# Patient Record
Sex: Male | Born: 1960 | Race: White | Hispanic: No | Marital: Married | Smoking: Never smoker
Health system: Southern US, Community
[De-identification: ages and names within clinical notes are randomized; demographics above are authoritative.]

## PROBLEM LIST (undated history)

## (undated) DIAGNOSIS — F419 Anxiety disorder, unspecified: Secondary | ICD-10-CM

---

## 1997-07-08 ENCOUNTER — Emergency Department (HOSPITAL_COMMUNITY): Admission: EM | Admit: 1997-07-08 | Discharge: 1997-07-08 | Payer: Self-pay | Admitting: Emergency Medicine

## 1998-10-09 ENCOUNTER — Encounter: Payer: Self-pay | Admitting: Emergency Medicine

## 1998-10-09 ENCOUNTER — Emergency Department (HOSPITAL_COMMUNITY): Admission: EM | Admit: 1998-10-09 | Discharge: 1998-10-10 | Payer: Self-pay | Admitting: Emergency Medicine

## 1998-11-25 ENCOUNTER — Ambulatory Visit (HOSPITAL_COMMUNITY): Admission: RE | Admit: 1998-11-25 | Discharge: 1998-11-25 | Payer: Self-pay

## 2016-03-18 DIAGNOSIS — Z0000 Encounter for general adult medical examination without abnormal findings: Secondary | ICD-10-CM | POA: Diagnosis not present

## 2016-05-10 DIAGNOSIS — D2262 Melanocytic nevi of left upper limb, including shoulder: Secondary | ICD-10-CM | POA: Diagnosis not present

## 2016-05-10 DIAGNOSIS — D225 Melanocytic nevi of trunk: Secondary | ICD-10-CM | POA: Diagnosis not present

## 2016-05-10 DIAGNOSIS — D2261 Melanocytic nevi of right upper limb, including shoulder: Secondary | ICD-10-CM | POA: Diagnosis not present

## 2016-05-27 DIAGNOSIS — Z0000 Encounter for general adult medical examination without abnormal findings: Secondary | ICD-10-CM | POA: Diagnosis not present

## 2016-05-27 DIAGNOSIS — Z13220 Encounter for screening for lipoid disorders: Secondary | ICD-10-CM | POA: Diagnosis not present

## 2016-07-19 DIAGNOSIS — R972 Elevated prostate specific antigen [PSA]: Secondary | ICD-10-CM | POA: Diagnosis not present

## 2016-09-09 DIAGNOSIS — R972 Elevated prostate specific antigen [PSA]: Secondary | ICD-10-CM | POA: Diagnosis not present

## 2016-09-09 DIAGNOSIS — R350 Frequency of micturition: Secondary | ICD-10-CM | POA: Diagnosis not present

## 2017-01-22 DIAGNOSIS — H5711 Ocular pain, right eye: Secondary | ICD-10-CM | POA: Diagnosis not present

## 2017-02-27 ENCOUNTER — Encounter (HOSPITAL_COMMUNITY): Payer: Self-pay

## 2017-02-27 ENCOUNTER — Emergency Department (HOSPITAL_COMMUNITY)
Admission: EM | Admit: 2017-02-27 | Discharge: 2017-02-27 | Disposition: A | Discharge status: Home / Self Care | Payer: 59 | Attending: Emergency Medicine | Admitting: Emergency Medicine

## 2017-02-27 ENCOUNTER — Other Ambulatory Visit: Payer: Self-pay

## 2017-02-27 ENCOUNTER — Emergency Department (HOSPITAL_COMMUNITY): Payer: 59

## 2017-02-27 DIAGNOSIS — S0280XA Fracture of other specified skull and facial bones, unspecified side, initial encounter for closed fracture: Secondary | ICD-10-CM | POA: Insufficient documentation

## 2017-02-27 DIAGNOSIS — S0083XA Contusion of other part of head, initial encounter: Secondary | ICD-10-CM | POA: Diagnosis not present

## 2017-02-27 DIAGNOSIS — Y9367 Activity, basketball: Secondary | ICD-10-CM | POA: Diagnosis not present

## 2017-02-27 DIAGNOSIS — Y929 Unspecified place or not applicable: Secondary | ICD-10-CM | POA: Diagnosis not present

## 2017-02-27 DIAGNOSIS — W500XXA Accidental hit or strike by another person, initial encounter: Secondary | ICD-10-CM | POA: Diagnosis not present

## 2017-02-27 DIAGNOSIS — Y998 Other external cause status: Secondary | ICD-10-CM | POA: Diagnosis not present

## 2017-02-27 DIAGNOSIS — S0990XA Unspecified injury of head, initial encounter: Secondary | ICD-10-CM | POA: Diagnosis present

## 2017-02-27 DIAGNOSIS — S0285XA Fracture of orbit, unspecified, initial encounter for closed fracture: Secondary | ICD-10-CM

## 2017-02-27 DIAGNOSIS — S060X1A Concussion with loss of consciousness of 30 minutes or less, initial encounter: Secondary | ICD-10-CM | POA: Diagnosis not present

## 2017-02-27 DIAGNOSIS — S0231XA Fracture of orbital floor, right side, initial encounter for closed fracture: Secondary | ICD-10-CM | POA: Diagnosis not present

## 2017-02-27 HISTORY — DX: Anxiety disorder, unspecified: F41.9

## 2017-02-27 MED ORDER — HYDROCODONE-ACETAMINOPHEN 5-325 MG PO TABS: 1.0000 | ORAL_TABLET | ORAL | 0 refills | Status: AC | PRN

## 2017-02-27 MED ORDER — AMOXICILLIN-POT CLAVULANATE 875-125 MG PO TABS: 1.0000 | ORAL_TABLET | Freq: Two times a day (BID) | ORAL | 0 refills | Status: AC

## 2017-02-27 NOTE — ED Provider Notes (Signed)
[facility] Provider Note   CSN: [id number] Arrival date & time: 02/27/17  1601     History   Chief Complaint Chief Complaint  Patient presents with  . Head Injury    HPI Shawn Rodgers is a 57 y.o. male.  57 year old male presents with right-sided face pain after being struck with an elbow while playing basketball.  Had a brief loss of consciousness and now complains of pain to his right maxilla.  Denies any blurred vision or diplopia.  No headache or vomiting.  No neck pain.  Has had some paresthesias to his right upper teeth but denies any problems with mastication.  No treatment used prior to arrival.      Past Medical History:  Diagnosis Date  . Anxiety     There are no active problems to display for this patient.   History reviewed. No pertinent surgical history.     Home Medications    Prior to Admission medications   Not on File    Family History History reviewed. No pertinent family history.  Social History Social History   Tobacco Use  . Smoking status: Never Smoker  . Smokeless tobacco: Never Used  Substance Use Topics  . Alcohol use: Yes    Comment: occ  . Drug use: No     Allergies   Patient has no known allergies.   Review of Systems Review of Systems  All other systems reviewed and are negative.    Physical Exam Updated Vital Signs BP (!) 152/100   Pulse 70   Temp 97.8 F (36.6 C) (Oral)   Resp 18   Ht 1.854 m (6\' 1" )   Wt 95.3 kg (210 lb)   SpO2 100%   BMI 27.71 kg/m   Physical Exam  Constitutional: He is oriented to person, place, and time. He appears well-developed and well-nourished.  Non-toxic appearance. No distress.  HENT:  Head:    Eyes: Conjunctivae, EOM and lids are normal. Pupils are equal, round, and reactive to light. Right eye exhibits no chemosis. Left eye exhibits no chemosis.  Neck: Normal range of motion. Neck supple. No tracheal deviation present. No  thyroid mass present.  Cardiovascular: Normal rate, regular rhythm and normal heart sounds. Exam reveals no gallop.  No murmur heard. Pulmonary/Chest: Effort normal and breath sounds normal. No stridor. No respiratory distress. He has no decreased breath sounds. He has no wheezes. He has no rhonchi. He has no rales.  Abdominal: Soft. Normal appearance and bowel sounds are normal. He exhibits no distension. There is no tenderness. There is no rebound and no CVA tenderness.  Musculoskeletal: Normal range of motion. He exhibits no edema or tenderness.  Neurological: He is alert and oriented to person, place, and time. He has normal strength. No cranial nerve deficit or sensory deficit. GCS eye subscore is 4. GCS verbal subscore is 5. GCS motor subscore is 6.  Skin: Skin is warm and dry. No abrasion and no rash noted.  Psychiatric: He has a normal mood and affect. His speech is normal and behavior is normal.  Nursing note and vitals reviewed.    ED Treatments / Results  Labs (all labs ordered are listed, but only abnormal results are displayed) Labs Reviewed - No data to display  EKG  EKG Interpretation None       Radiology No results found.  Procedures Procedures (including critical care time)  Medications Ordered in ED Medications - No data to display  Initial Impression / Assessment and Plan / ED Course  I have reviewed the triage vital signs and the nursing notes.  Pertinent labs & imaging results that were available during my care of the patient were reviewed by me and considered in my medical decision making (see chart for details).     Head CT results noted.  Discussed with Dr. Mancel Parsons, maxillofacial surgeon on call, will see the patient in follow-up.  Patient instructed to not blow his nose and will place on antibiotics.  Final Clinical Impressions(s) / ED Diagnoses   Final diagnoses:  None    ED Discharge Orders    None       Lacretia Leigh, MD 02/27/17  1824

## 2017-02-27 NOTE — ED Triage Notes (Signed)
Pt states he was playing basketball and was elbowed in the face. Pt states brief period of LOC. Pt has redness and pain to the right face. AOX4 now.

## 2017-02-27 NOTE — Discharge Instructions (Signed)
Do not blow your nose if you have to sneeze, please sneeze through your mouth

## 2017-04-08 DIAGNOSIS — Z0000 Encounter for general adult medical examination without abnormal findings: Secondary | ICD-10-CM | POA: Diagnosis not present

## 2017-04-08 DIAGNOSIS — Z13220 Encounter for screening for lipoid disorders: Secondary | ICD-10-CM | POA: Diagnosis not present

## 2017-04-20 DIAGNOSIS — D509 Iron deficiency anemia, unspecified: Secondary | ICD-10-CM | POA: Diagnosis not present

## 2017-06-21 DIAGNOSIS — D509 Iron deficiency anemia, unspecified: Secondary | ICD-10-CM | POA: Diagnosis not present

## 2017-07-07 DIAGNOSIS — D509 Iron deficiency anemia, unspecified: Secondary | ICD-10-CM | POA: Diagnosis not present

## 2017-08-02 DIAGNOSIS — D509 Iron deficiency anemia, unspecified: Secondary | ICD-10-CM | POA: Diagnosis not present

## 2017-08-02 DIAGNOSIS — K5730 Diverticulosis of large intestine without perforation or abscess without bleeding: Secondary | ICD-10-CM | POA: Diagnosis not present

## 2017-08-02 DIAGNOSIS — D126 Benign neoplasm of colon, unspecified: Secondary | ICD-10-CM | POA: Diagnosis not present

## 2017-10-03 DIAGNOSIS — D509 Iron deficiency anemia, unspecified: Secondary | ICD-10-CM | POA: Diagnosis not present

## 2017-10-04 DIAGNOSIS — H903 Sensorineural hearing loss, bilateral: Secondary | ICD-10-CM | POA: Diagnosis not present

## 2017-10-21 DIAGNOSIS — Z23 Encounter for immunization: Secondary | ICD-10-CM | POA: Diagnosis not present

## 2017-11-01 DIAGNOSIS — H524 Presbyopia: Secondary | ICD-10-CM | POA: Diagnosis not present

## 2017-11-01 DIAGNOSIS — H5213 Myopia, bilateral: Secondary | ICD-10-CM | POA: Diagnosis not present

## 2018-02-20 DIAGNOSIS — M5417 Radiculopathy, lumbosacral region: Secondary | ICD-10-CM | POA: Diagnosis not present

## 2018-02-20 DIAGNOSIS — M9903 Segmental and somatic dysfunction of lumbar region: Secondary | ICD-10-CM | POA: Diagnosis not present

## 2018-02-20 DIAGNOSIS — M9905 Segmental and somatic dysfunction of pelvic region: Secondary | ICD-10-CM | POA: Diagnosis not present

## 2018-02-21 DIAGNOSIS — M5417 Radiculopathy, lumbosacral region: Secondary | ICD-10-CM | POA: Diagnosis not present

## 2018-02-21 DIAGNOSIS — M9905 Segmental and somatic dysfunction of pelvic region: Secondary | ICD-10-CM | POA: Diagnosis not present

## 2018-02-21 DIAGNOSIS — M9903 Segmental and somatic dysfunction of lumbar region: Secondary | ICD-10-CM | POA: Diagnosis not present

## 2018-02-22 DIAGNOSIS — M5417 Radiculopathy, lumbosacral region: Secondary | ICD-10-CM | POA: Diagnosis not present

## 2018-02-22 DIAGNOSIS — M9903 Segmental and somatic dysfunction of lumbar region: Secondary | ICD-10-CM | POA: Diagnosis not present

## 2018-02-22 DIAGNOSIS — M9905 Segmental and somatic dysfunction of pelvic region: Secondary | ICD-10-CM | POA: Diagnosis not present

## 2018-02-24 DIAGNOSIS — M9903 Segmental and somatic dysfunction of lumbar region: Secondary | ICD-10-CM | POA: Diagnosis not present

## 2018-02-24 DIAGNOSIS — M9905 Segmental and somatic dysfunction of pelvic region: Secondary | ICD-10-CM | POA: Diagnosis not present

## 2018-02-24 DIAGNOSIS — M5417 Radiculopathy, lumbosacral region: Secondary | ICD-10-CM | POA: Diagnosis not present

## 2018-02-27 DIAGNOSIS — M9903 Segmental and somatic dysfunction of lumbar region: Secondary | ICD-10-CM | POA: Diagnosis not present

## 2018-02-27 DIAGNOSIS — M9905 Segmental and somatic dysfunction of pelvic region: Secondary | ICD-10-CM | POA: Diagnosis not present

## 2018-02-27 DIAGNOSIS — M5417 Radiculopathy, lumbosacral region: Secondary | ICD-10-CM | POA: Diagnosis not present

## 2018-03-01 DIAGNOSIS — M5417 Radiculopathy, lumbosacral region: Secondary | ICD-10-CM | POA: Diagnosis not present

## 2018-03-01 DIAGNOSIS — M9903 Segmental and somatic dysfunction of lumbar region: Secondary | ICD-10-CM | POA: Diagnosis not present

## 2018-03-01 DIAGNOSIS — M9905 Segmental and somatic dysfunction of pelvic region: Secondary | ICD-10-CM | POA: Diagnosis not present

## 2018-03-03 DIAGNOSIS — M9903 Segmental and somatic dysfunction of lumbar region: Secondary | ICD-10-CM | POA: Diagnosis not present

## 2018-03-03 DIAGNOSIS — M9905 Segmental and somatic dysfunction of pelvic region: Secondary | ICD-10-CM | POA: Diagnosis not present

## 2018-03-03 DIAGNOSIS — M5417 Radiculopathy, lumbosacral region: Secondary | ICD-10-CM | POA: Diagnosis not present

## 2018-03-07 DIAGNOSIS — M9903 Segmental and somatic dysfunction of lumbar region: Secondary | ICD-10-CM | POA: Diagnosis not present

## 2018-03-07 DIAGNOSIS — M5417 Radiculopathy, lumbosacral region: Secondary | ICD-10-CM | POA: Diagnosis not present

## 2018-03-07 DIAGNOSIS — M9905 Segmental and somatic dysfunction of pelvic region: Secondary | ICD-10-CM | POA: Diagnosis not present

## 2018-03-09 DIAGNOSIS — M9905 Segmental and somatic dysfunction of pelvic region: Secondary | ICD-10-CM | POA: Diagnosis not present

## 2018-03-09 DIAGNOSIS — M5417 Radiculopathy, lumbosacral region: Secondary | ICD-10-CM | POA: Diagnosis not present

## 2018-03-09 DIAGNOSIS — M9903 Segmental and somatic dysfunction of lumbar region: Secondary | ICD-10-CM | POA: Diagnosis not present

## 2018-03-14 DIAGNOSIS — M9903 Segmental and somatic dysfunction of lumbar region: Secondary | ICD-10-CM | POA: Diagnosis not present

## 2018-03-14 DIAGNOSIS — M5417 Radiculopathy, lumbosacral region: Secondary | ICD-10-CM | POA: Diagnosis not present

## 2018-03-14 DIAGNOSIS — M9905 Segmental and somatic dysfunction of pelvic region: Secondary | ICD-10-CM | POA: Diagnosis not present

## 2018-03-16 DIAGNOSIS — M5417 Radiculopathy, lumbosacral region: Secondary | ICD-10-CM | POA: Diagnosis not present

## 2018-03-16 DIAGNOSIS — M9903 Segmental and somatic dysfunction of lumbar region: Secondary | ICD-10-CM | POA: Diagnosis not present

## 2018-03-16 DIAGNOSIS — M9905 Segmental and somatic dysfunction of pelvic region: Secondary | ICD-10-CM | POA: Diagnosis not present

## 2018-03-21 DIAGNOSIS — M9903 Segmental and somatic dysfunction of lumbar region: Secondary | ICD-10-CM | POA: Diagnosis not present

## 2018-03-21 DIAGNOSIS — M5417 Radiculopathy, lumbosacral region: Secondary | ICD-10-CM | POA: Diagnosis not present

## 2018-03-21 DIAGNOSIS — M9905 Segmental and somatic dysfunction of pelvic region: Secondary | ICD-10-CM | POA: Diagnosis not present

## 2018-05-03 DIAGNOSIS — D509 Iron deficiency anemia, unspecified: Secondary | ICD-10-CM | POA: Diagnosis not present

## 2018-05-03 DIAGNOSIS — Z0000 Encounter for general adult medical examination without abnormal findings: Secondary | ICD-10-CM | POA: Diagnosis not present

## 2018-05-03 DIAGNOSIS — Z86010 Personal history of colonic polyps: Secondary | ICD-10-CM | POA: Diagnosis not present

## 2018-05-03 DIAGNOSIS — Z125 Encounter for screening for malignant neoplasm of prostate: Secondary | ICD-10-CM | POA: Diagnosis not present

## 2018-05-03 DIAGNOSIS — Z13220 Encounter for screening for lipoid disorders: Secondary | ICD-10-CM | POA: Diagnosis not present

## 2020-01-15 IMAGING — CT CT MAXILLOFACIAL W/O CM
3 series · 15 of 47 positions shown, 18 images · non-contrast
Comparison: None.

CLINICAL DATA: Injury to right eye while playing basketball.
Swelling and bruise.

EXAM:
CT MAXILLOFACIAL WITHOUT CONTRAST
TECHNIQUE: Multidetector CT imaging of the maxillofacial structures was
performed. Multiplanar CT image reconstructions were also generated.

[Series 3: facial/ orbits 2.0 h30s · axial · 0.39mm/px · z∈[-220,-72]mm · 9 of 86 slices shown, 12 images]
[im 6/86  brain]
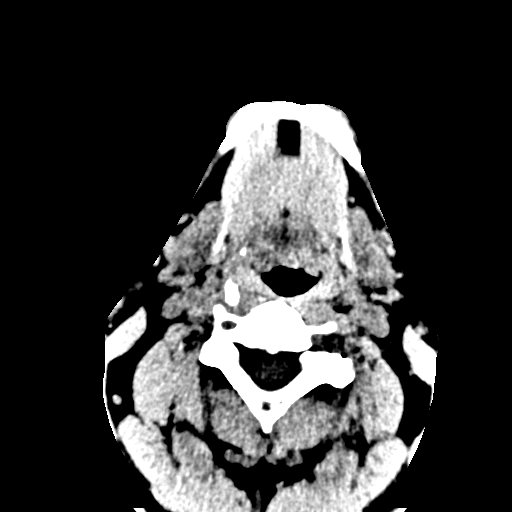
[im 6/86  bone]
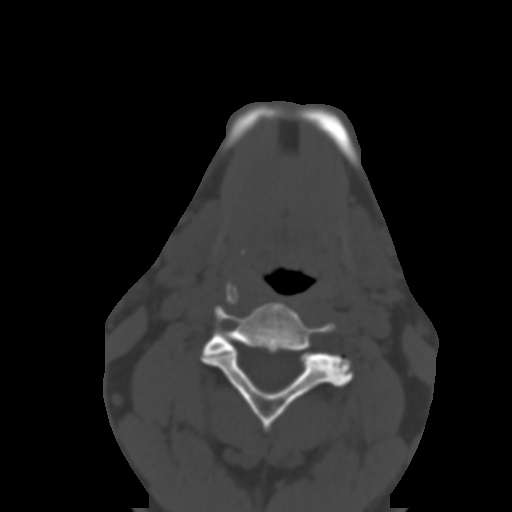
[im 15/86  bone]
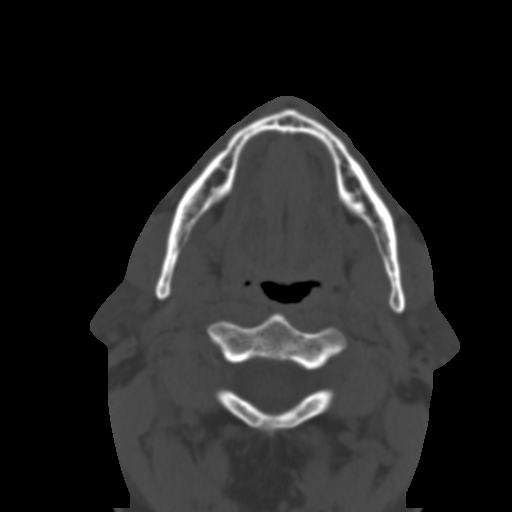
[im 24/86  bone]
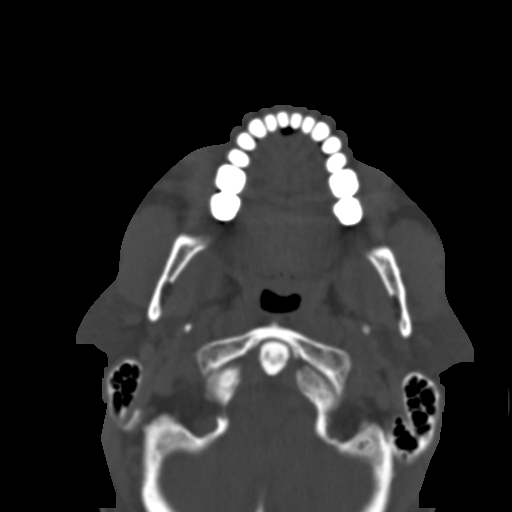
[im 33/86  bone]
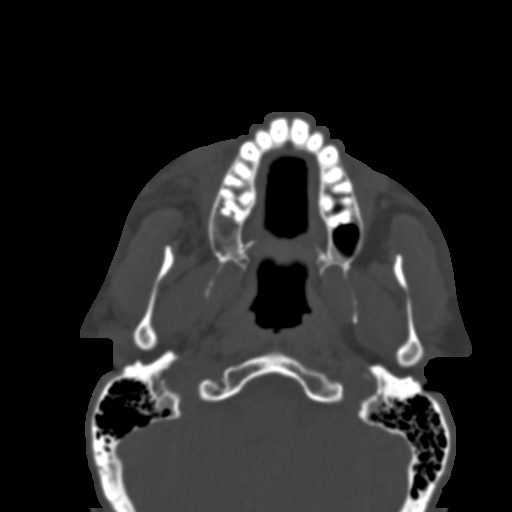
[im 44/86  brain]
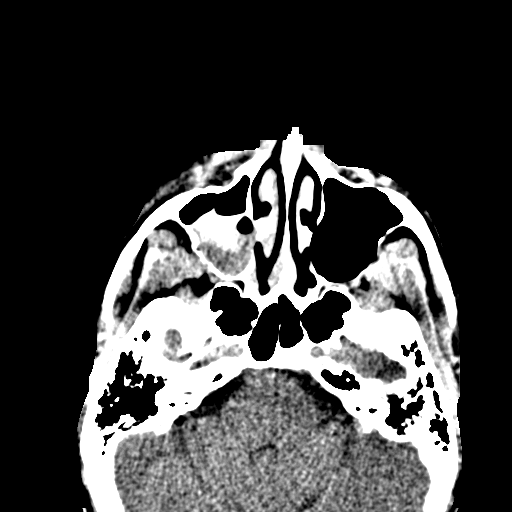
[im 44/86  bone]
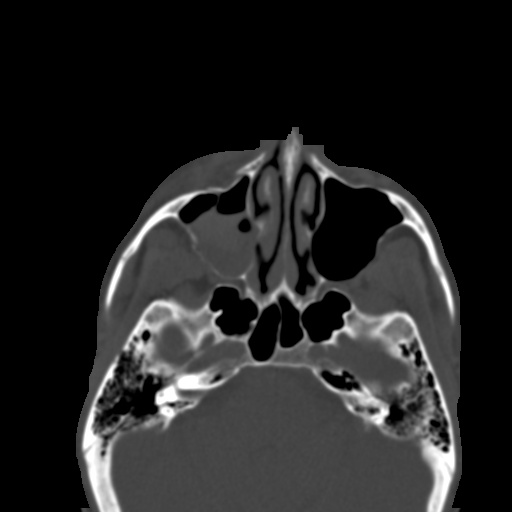
[im 53/86  bone]
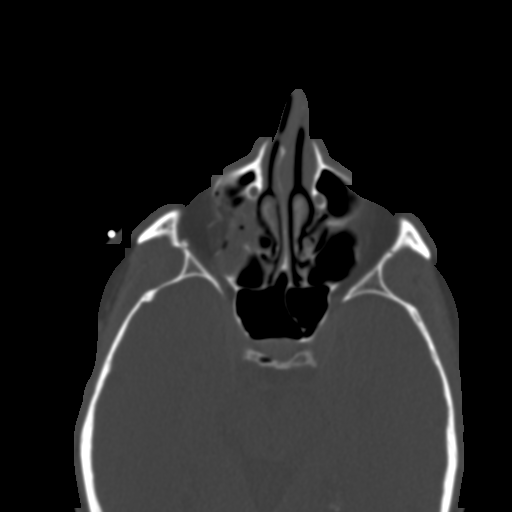
[im 62/86  bone]
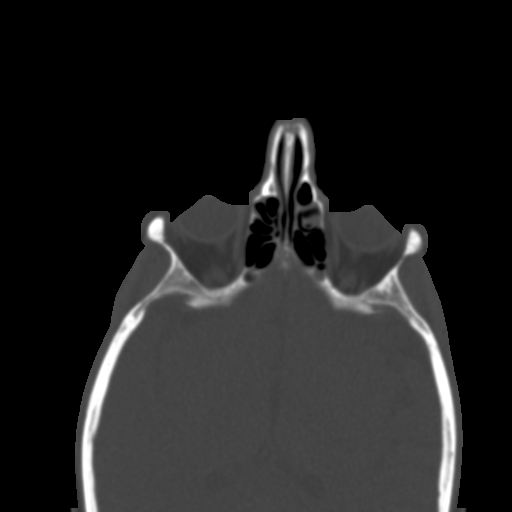
[im 71/86  bone]
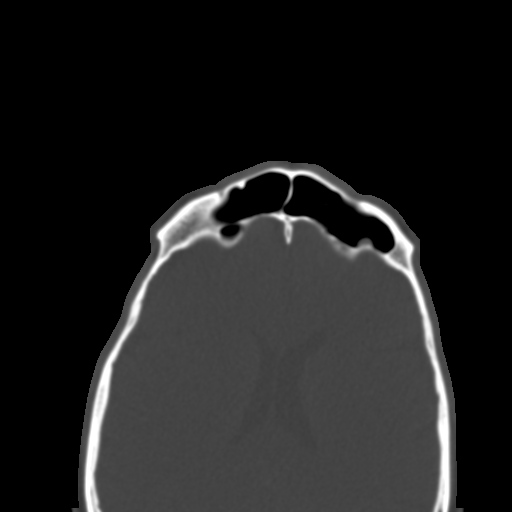
[im 80/86  brain]
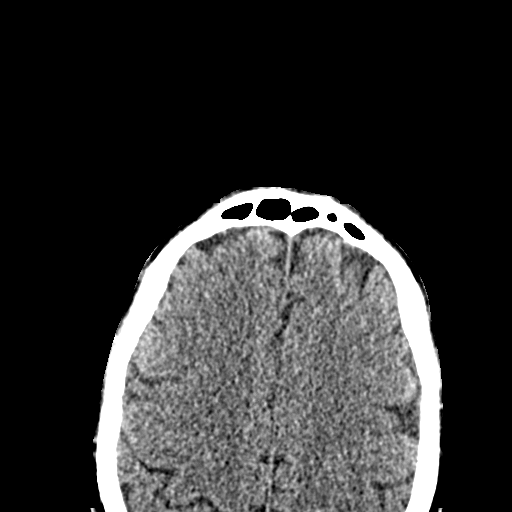
[im 80/86  bone]
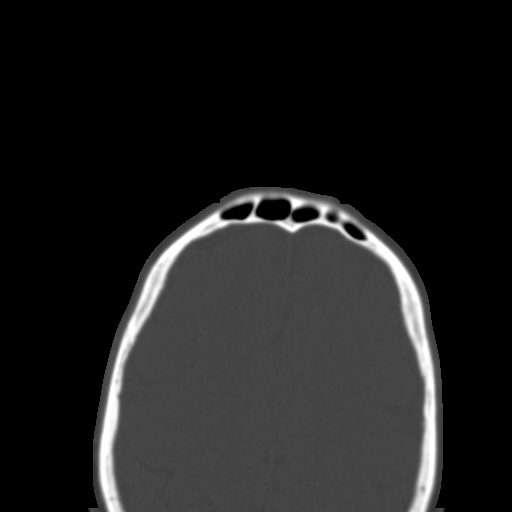

[Series 7: coronal soft tissue · coronal · 0.37mm/px · 3 of 92 slices shown]
[im 31/92  bone]
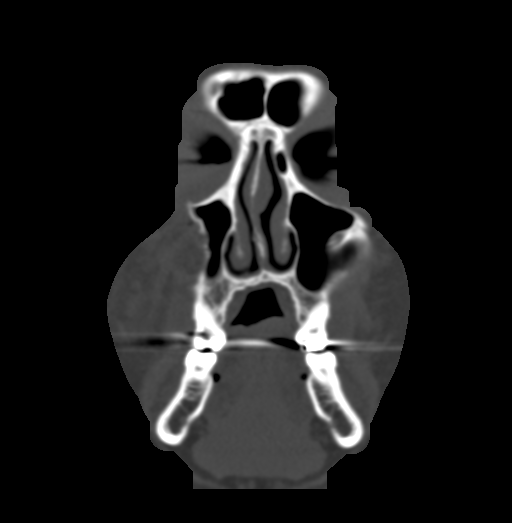
[im 41/92  bone]
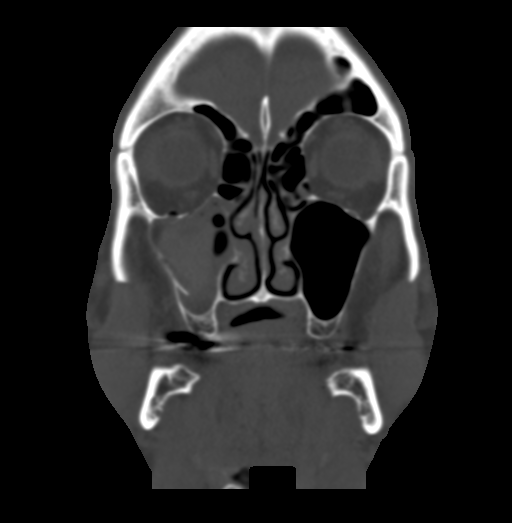
[im 51/92  bone]
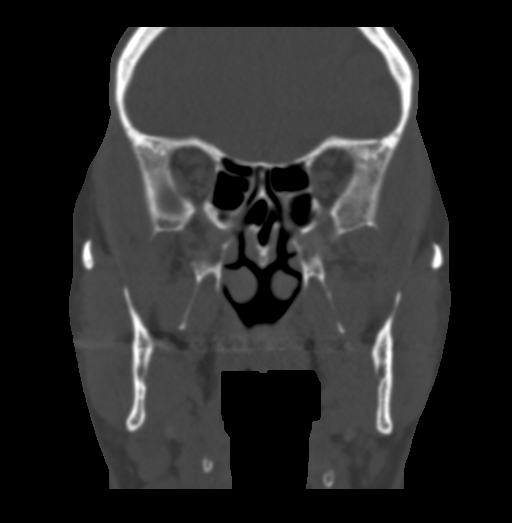

[Series 8: sagittal soft tissue · sagittal · 0.39mm/px · 3 of 100 slices shown]
[im 34/100  bone]
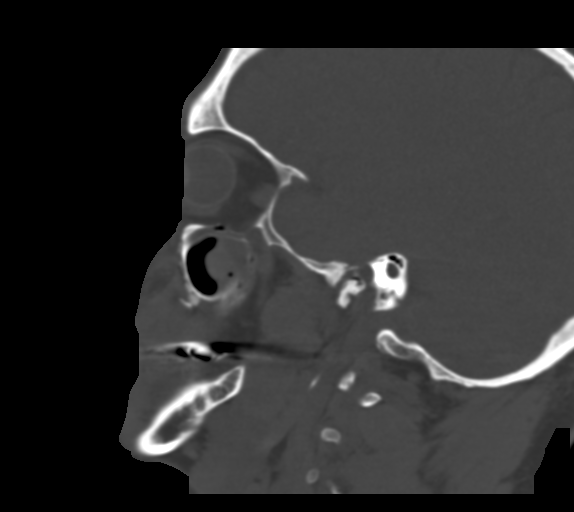
[im 50/100  bone]
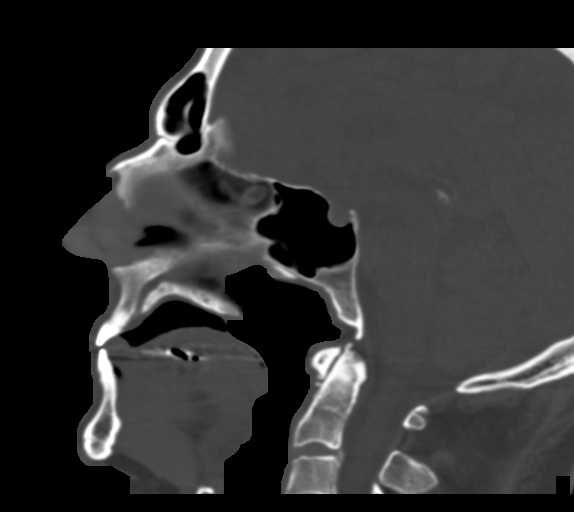
[im 67/100  bone]
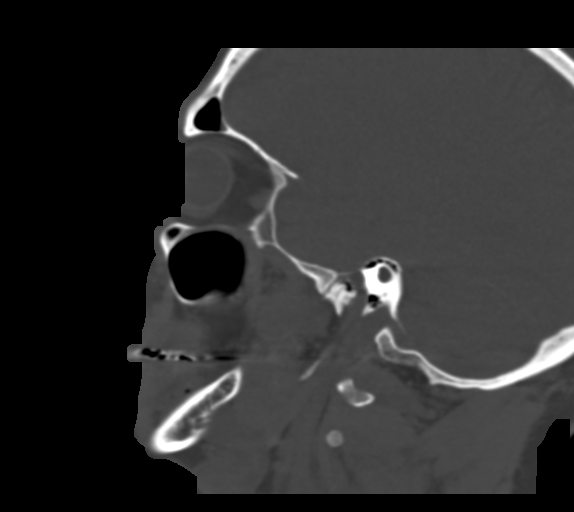

[15 of 47 positions shown; findings below may reference images not displayed]

FINDINGS: Osseous: Displaced/comminuted fracture of the right orbital floor. A
small superiorly directed fracture fragment abuts the anterior
portion of the overlying inferior rectus muscle with possible
associated mass effect (best seen on coronal series 7 and 9, images
34 through 36; axial series 3, image 54).

The orbital floor fracture is contiguous with an additional
displaced/comminuted fracture of the anterior wall of the right
maxillary sinus. There is an additional displaced/comminuted
fracture involving the posterior wall of the right maxillary sinus.
Associated fluid within the right maxillary sinus.

Slightly displaced fracture of the right lateral orbital wall. Right
orbital roof and lamina papyracea are intact and normally aligned.

Osseous structures about the left orbit are intact and normally
aligned throughout. Lower frontal bones are intact. No displaced
nasal bone fracture seen. Osseous structures about the left
maxillary sinus are intact and normally aligned. Bilateral zygoma
and pterygoid plates are intact. No mandible fracture or
displacement.

Orbits: Both orbital globes appear intact and symmetric in
configuration. No retro-orbital hemorrhage or edema.

Sinuses: Fluid within the right maxillary sinus, as described above.
Remainder of the paranasal sinuses are clear.

Soft tissues: Soft tissue edema overlying the right maxilla. No
circumscribed soft tissue hematoma appreciated.

Limited intracranial: No significant or unexpected finding.
IMPRESSION: 1. Displaced/comminuted fracture of the right orbital floor. A small
superiorly directed fracture fragment abuts the anterior portion of
the overlying inferior rectus muscle, with possible associated mass
effect (image numbers detailed above).
2. Orbital floor fracture is contiguous with a displaced/comminuted
fracture of the anterior wall of the right maxillary sinus.
Additional displaced/comminuted fracture involving the posterior
wall of the right maxillary sinus. Associated fluid/hemorrhage
within the right maxillary sinus.
3. Slightly displaced fracture of the right lateral orbital wall. No
appreciable mass effect on the adjacent lateral rectus muscle.
4. Right orbital globe appears intact and appropriate in position.
5. No additional facial bone fracture or displacement seen.

## 2021-09-24 ENCOUNTER — Other Ambulatory Visit: Payer: Self-pay | Admitting: Urology

## 2021-09-24 DIAGNOSIS — R972 Elevated prostate specific antigen [PSA]: Secondary | ICD-10-CM

## 2021-10-15 ENCOUNTER — Ambulatory Visit
Admission: RE | Admit: 2021-10-15 | Discharge: 2021-10-15 | Disposition: A | Discharge status: Home / Self Care | Payer: 59 | Source: Ambulatory Visit | Attending: Urology | Admitting: Urology

## 2021-10-15 DIAGNOSIS — R972 Elevated prostate specific antigen [PSA]: Secondary | ICD-10-CM

## 2021-10-15 MED ORDER — GADOPICLENOL 0.5 MMOL/ML IV SOLN
10.0000 mL | Freq: Once | INTRAVENOUS | Status: AC | PRN
  Administered 2021-10-15: 10 mL via INTRAVENOUS

## 2022-09-18 ENCOUNTER — Other Ambulatory Visit: Payer: Self-pay

## 2022-09-18 ENCOUNTER — Emergency Department (HOSPITAL_BASED_OUTPATIENT_CLINIC_OR_DEPARTMENT_OTHER): Payer: 59

## 2022-09-18 ENCOUNTER — Emergency Department (HOSPITAL_BASED_OUTPATIENT_CLINIC_OR_DEPARTMENT_OTHER)
Admission: EM | Admit: 2022-09-18 | Discharge: 2022-09-18 | Disposition: A | Discharge status: Home / Self Care | Payer: 59 | Attending: Emergency Medicine | Admitting: Emergency Medicine

## 2022-09-18 DIAGNOSIS — M4313 Spondylolisthesis, cervicothoracic region: Secondary | ICD-10-CM | POA: Insufficient documentation

## 2022-09-18 DIAGNOSIS — M5134 Other intervertebral disc degeneration, thoracic region: Secondary | ICD-10-CM

## 2022-09-18 DIAGNOSIS — M542 Cervicalgia: Secondary | ICD-10-CM | POA: Diagnosis present

## 2022-09-18 DIAGNOSIS — M4802 Spinal stenosis, cervical region: Secondary | ICD-10-CM

## 2022-09-18 DIAGNOSIS — M9971 Connective tissue and disc stenosis of intervertebral foramina of cervical region: Secondary | ICD-10-CM | POA: Diagnosis not present

## 2022-09-18 DIAGNOSIS — M5030 Other cervical disc degeneration, unspecified cervical region: Secondary | ICD-10-CM

## 2022-09-18 MED ORDER — PREDNISONE 10 MG (21) PO TBPK: ORAL_TABLET | Freq: Every day | ORAL | 0 refills | Status: AC

## 2022-09-18 MED ORDER — LIDOCAINE 5 % EX PTCH
1.0000 | MEDICATED_PATCH | CUTANEOUS | Status: DC
  Administered 2022-09-18: 1 via TRANSDERMAL
  Filled 2022-09-18: qty 1

## 2022-09-18 MED ORDER — DIAZEPAM 5 MG PO TABS
5.0000 mg | ORAL_TABLET | Freq: Once | ORAL | Status: AC
  Administered 2022-09-18: 5 mg via ORAL
  Filled 2022-09-18: qty 1

## 2022-09-18 MED ORDER — KETOROLAC TROMETHAMINE 15 MG/ML IJ SOLN
30.0000 mg | Freq: Once | INTRAMUSCULAR | Status: AC
  Administered 2022-09-18: 30 mg via INTRAMUSCULAR
  Filled 2022-09-18: qty 2

## 2022-09-18 MED ORDER — CYCLOBENZAPRINE HCL 10 MG PO TABS: 10.0000 mg | ORAL_TABLET | Freq: Two times a day (BID) | ORAL | 0 refills | Status: AC | PRN

## 2022-09-18 MED ORDER — MUSCLE RUB 10-15 % EX CREA: TOPICAL_CREAM | CUTANEOUS | Status: DC | PRN

## 2022-09-18 MED ORDER — CYCLOBENZAPRINE HCL 5 MG PO TABS
7.5000 mg | ORAL_TABLET | Freq: Once | ORAL | Status: AC
  Administered 2022-09-18: 7.5 mg via ORAL
  Filled 2022-09-18: qty 2
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
## 2022-09-18 NOTE — Discharge Instructions (Addendum)
You have narrowing of your spine, as well as degenerative disc disease.  It appears to be moderate to severe and I think you are having an acute exacerbation of this.  I have prescribed you some steroids, muscle relaxers as for the pain.  Take Tylenol and ibuprofen for your pain.  I have provided you with information to follow-up with a neurosurgeon for possible injections, versus surgical options.  You may need to do physical therapy this may help with this.  I recommend that you return to the ER immediately if you have any numbness, tingling in your bilateral arms, or new weakness

## 2024-01-03 ENCOUNTER — Ambulatory Visit: Admission: EM | Admit: 2024-01-03 | Discharge: 2024-01-03 | Disposition: A | Discharge status: Home / Self Care

## 2024-01-03 ENCOUNTER — Encounter: Payer: Self-pay | Admitting: Emergency Medicine

## 2024-01-03 DIAGNOSIS — R051 Acute cough: Secondary | ICD-10-CM | POA: Diagnosis not present

## 2024-01-03 DIAGNOSIS — B349 Viral infection, unspecified: Secondary | ICD-10-CM

## 2024-01-03 LAB — POC COVID19/FLU A&B COMBO
Covid Antigen, POC: NEGATIVE
Influenza A Antigen, POC: NEGATIVE
Influenza B Antigen, POC: NEGATIVE

## 2024-01-03 NOTE — ED Triage Notes (Signed)
 Pt c/o fever, not feeling well, body aches, and deep cough that began today.

## 2024-01-03 NOTE — Discharge Instructions (Addendum)
 Tylenol  for fever.  Return if symptoms worsen or change.

## 2024-01-03 NOTE — ED Provider Notes (Addendum)
 " [facility]    CSN: [id number] Arrival date & time: 01/03/24  1806      History   Chief Complaint Chief Complaint  Patient presents with   Fever    HPI Shawn Rodgers is a 63 y.o. male.   Patient complains of a fever and chills that started today.  Patient reports that he has a slight cough and some congestion.  He does not have any known exposure to flu or COVID.  Patient denies any nausea or vomiting he is not having any diarrhea.  Patient is not having any chest pain or shortness of breath  The history is provided by the patient. No language interpreter was used.  Fever   Past Medical History:  Diagnosis Date   Anxiety     There are no active problems to display for this patient.   History reviewed. No pertinent surgical history.     Home Medications    Prior to Admission medications  Medication Sig Start Date End Date Taking? Authorizing Provider  amoxicillin -clavulanate (AUGMENTIN ) 875-125 MG tablet Take 1 tablet by mouth every 12 (twelve) hours. Patient not taking: Reported on 01/03/2024 02/27/17   Dasie Faden, MD  cyclobenzaprine  (FLEXERIL ) 10 MG tablet Take 1 tablet (10 mg total) by mouth 2 (two) times daily as needed for muscle spasms. 09/18/22   Small, Brooke L, PA  HYDROcodone -acetaminophen  (NORCO/VICODIN) 5-325 MG tablet Take 1-2 tablets by mouth every 4 (four) hours as needed. 02/27/17   Dasie Faden, MD  predniSONE  (STERAPRED UNI-PAK 21 TAB) 10 MG (21) TBPK tablet Take by mouth daily. Take 6 tabs by mouth daily  for 2 days, then 5 tabs for 2 days, then 4 tabs for 2 days, then 3 tabs for 2 days, 2 tabs for 2 days, then 1 tab by mouth daily for 2 days Patient not taking: Reported on 01/03/2024 09/18/22   Small, Brooke L, PA  rosuvastatin (CRESTOR) 40 MG tablet Take 40 mg by mouth daily.    [provider]  sertraline (ZOLOFT) 100 MG tablet Take 100 mg by mouth daily.    [provider]    Family History History reviewed. No  pertinent family history.  Social History Social History[1]   Allergies   Patient has no known allergies.   Review of Systems Review of Systems  Constitutional:  Positive for fever.  All other systems reviewed and are negative.    Physical Exam Triage Vital Signs ED Triage Vitals  Encounter Vitals Group     BP 01/03/24 1837 (!) 161/97     Girls Systolic BP Percentile --      Girls Diastolic BP Percentile --      Boys Systolic BP Percentile --      Boys Diastolic BP Percentile --      Pulse Rate 01/03/24 1837 88     Resp 01/03/24 1837 17     Temp 01/03/24 1837 (!) 100.6 F (38.1 C)     Temp Source 01/03/24 1837 Oral     SpO2 01/03/24 1837 96 %     Weight --      Height --      Head Circumference --      Peak Flow --      Pain Score 01/03/24 1838 4     Pain Loc --      Pain Education --      Exclude from Growth Chart --    No data found.  Updated Vital Signs BP ROLLEN)  161/97 (BP Location: Right Arm)   Pulse 88   Temp (!) 100.6 F (38.1 C) (Oral)   Resp 17   SpO2 96%   Visual Acuity Right Eye Distance:   Left Eye Distance:   Bilateral Distance:    Right Eye Near:   Left Eye Near:    Bilateral Near:     Physical Exam Vitals and nursing note reviewed.  Constitutional:      Appearance: He is well-developed.  HENT:     Head: Normocephalic.     Nose: Nose normal.     Mouth/Throat:     Mouth: Mucous membranes are moist.  Cardiovascular:     Rate and Rhythm: Normal rate.  Pulmonary:     Effort: Pulmonary effort is normal.  Abdominal:     General: There is no distension.  Musculoskeletal:        General: Normal range of motion.     Cervical back: Normal range of motion.  Skin:    General: Skin is warm.  Neurological:     General: No focal deficit present.     Mental Status: He is alert and oriented to person, place, and time.   Yet   UC Treatments / Results  Labs (all labs ordered are listed, but only abnormal results are displayed) Labs  Reviewed  POC COVID19/FLU A&B COMBO    EKG   Radiology No results found.  Procedures Procedures (including critical care time)  Medications Ordered in UC Medications - No data to display  Initial Impression / Assessment and Plan / UC Course  I have reviewed the triage vital signs and the nursing notes.  Pertinent labs & imaging results that were available during my care of the patient were reviewed by me and considered in my medical decision making (see chart for details).   Patient most likely has a early viral infection.  Influenza and COVID are negative. Patient counseled on viral illness he is advised Tylenol . Final Clinical Impressions(s) / UC Diagnoses   Final diagnoses:  Acute cough  Viral illness     Discharge Instructions      Tylenol  for fever.  Return if symptoms worsen or change.    ED Prescriptions   None    PDMP not reviewed this encounter. An After Visit Summary was printed and given to the patient.       Flint Sonny POUR, [place] 01/03/24 1941     [1]  Social History Tobacco Use   Smoking status: Never   Smokeless tobacco: Never  Substance Use Topics   Alcohol use: Yes    Comment: occ   Drug use: No     Fl[place] Sonny POUR, PA-C 01/03/24 1943  "

## 2024-01-06 ENCOUNTER — Emergency Department (HOSPITAL_BASED_OUTPATIENT_CLINIC_OR_DEPARTMENT_OTHER)

## 2024-01-06 ENCOUNTER — Other Ambulatory Visit: Payer: Self-pay

## 2024-01-06 ENCOUNTER — Encounter (HOSPITAL_BASED_OUTPATIENT_CLINIC_OR_DEPARTMENT_OTHER): Payer: Self-pay

## 2024-01-06 DIAGNOSIS — J069 Acute upper respiratory infection, unspecified: Secondary | ICD-10-CM | POA: Insufficient documentation

## 2024-01-06 DIAGNOSIS — R059 Cough, unspecified: Secondary | ICD-10-CM | POA: Diagnosis present

## 2024-01-06 LAB — RESP PANEL BY RT-PCR (RSV, FLU A&B, COVID)  RVPGX2
Influenza A by PCR: POSITIVE — AB
Influenza B by PCR: NEGATIVE
Resp Syncytial Virus by PCR: NEGATIVE
SARS Coronavirus 2 by RT PCR: NEGATIVE

## 2024-01-06 NOTE — ED Triage Notes (Signed)
 Patient reports cough, fever, chills that started Monday and hasn't improved. Aox4, ambulatory with steady gait, NAD noted.

## 2024-01-07 ENCOUNTER — Emergency Department (HOSPITAL_BASED_OUTPATIENT_CLINIC_OR_DEPARTMENT_OTHER)
Admission: EM | Admit: 2024-01-07 | Discharge: 2024-01-07 | Disposition: A | Discharge status: Home / Self Care | Attending: Emergency Medicine | Admitting: Emergency Medicine

## 2024-01-07 DIAGNOSIS — J069 Acute upper respiratory infection, unspecified: Secondary | ICD-10-CM

## 2024-01-07 MED ORDER — KETOROLAC TROMETHAMINE 15 MG/ML IJ SOLN
30.0000 mg | Freq: Once | INTRAMUSCULAR | Status: AC
  Administered 2024-01-07: 30 mg via INTRAMUSCULAR
  Filled 2024-01-07: qty 2

## 2024-01-07 MED ORDER — CELECOXIB 200 MG PO CAPS: 200.0000 mg | ORAL_CAPSULE | Freq: Two times a day (BID) | ORAL | 0 refills | Status: AC

## 2024-01-07 MED ORDER — DEXAMETHASONE 4 MG PO TABS
10.0000 mg | ORAL_TABLET | Freq: Once | ORAL | Status: AC
  Administered 2024-01-07: 10 mg via ORAL
  Filled 2024-01-07: qty 3

## 2024-01-07 MED ORDER — ACETAMINOPHEN 500 MG PO TABS
1000.0000 mg | ORAL_TABLET | Freq: Once | ORAL | Status: AC
  Administered 2024-01-07: 1000 mg via ORAL
  Filled 2024-01-07: qty 2

## 2024-01-07 NOTE — ED Provider Notes (Signed)
 " [facility] Provider Note   CSN: [id number] Arrival date & time: 01/06/24  2114     History Chief Complaint  Patient presents with   Fever   Chills   Cough    HPI Shawn Rodgers is a 63 y.o. male presenting for chief complaint of fever and cough and chills since Monday. Coughing up phlegm. Trying pseudofed for symptom.   Patient's recorded medical, surgical, social, medication list and allergies were reviewed in the Snapshot window as part of the initial history.   Review of Systems   Review of Systems  Constitutional:  Positive for fever. Negative for chills.  HENT:  Positive for congestion and sinus pressure. Negative for ear pain and sore throat.   Eyes:  Negative for pain and visual disturbance.  Respiratory:  Positive for cough. Negative for shortness of breath.   Cardiovascular:  Negative for chest pain and palpitations.  Gastrointestinal:  Negative for abdominal pain and vomiting.  Genitourinary:  Negative for dysuria and hematuria.  Musculoskeletal:  Negative for arthralgias and back pain.  Skin:  Negative for color change and rash.  Neurological:  Negative for seizures and syncope.  All other systems reviewed and are negative.   Physical Exam Updated Vital Signs BP 128/89   Pulse 75   Temp 98.1 F (36.7 C) (Oral)   Resp 17   Ht 6' 1 (1.854 m)   Wt 95.3 kg   SpO2 98%   BMI 27.71 kg/m  Physical Exam Vitals and nursing note reviewed.  Constitutional:      General: He is not in acute distress.    Appearance: He is well-developed.  HENT:     Head: Normocephalic and atraumatic.  Eyes:     Conjunctiva/sclera: Conjunctivae normal.  Cardiovascular:     Rate and Rhythm: Normal rate and regular rhythm.     Heart sounds: No murmur heard. Pulmonary:     Effort: Pulmonary effort is normal. No respiratory distress.     Breath sounds: Normal breath sounds.  Abdominal:     Palpations: Abdomen is soft.      Tenderness: There is no abdominal tenderness.  Musculoskeletal:        General: No swelling.     Cervical back: Neck supple.  Skin:    General: Skin is warm and dry.     Capillary Refill: Capillary refill takes less than 2 seconds.  Neurological:     Mental Status: He is alert.  Psychiatric:        Mood and Affect: Mood normal.      ED Course/ Medical Decision Making/ A&P    Procedures Procedures   Medications Ordered in ED Medications  ketorolac  (TORADOL ) 15 MG/ML injection 30 mg (30 mg Intramuscular Given 01/07/24 0128)  acetaminophen  (TYLENOL ) tablet 1,000 mg (1,000 mg Oral Given 01/07/24 0128)  dexamethasone  (DECADRON ) tablet 10 mg (10 mg Oral Given 01/07/24 0128)   Medical Decision Making:   Shawn Rodgers is a 63 y.o. male who presented to the ED today with subjective fever, cough, congestion detailed above.    Complete initial physical exam performed, notably the patient  was hemodynamically stable in no acute distress.  Posterior oropharynx illuminated and without obvious swelling or deformity.  Patient is without neck stiffness.    Reviewed and confirmed nursing documentation for past medical history, family history, social history.    Initial Assessment:   With the patient's presentation of fever cough congestion, most likely diagnosis  is developing viral upper respiratory infection. Other diagnoses were considered including (but not limited to) peritonsillar abscess, retropharyngeal abscess, pneumonia. These are considered less likely due to history of present illness and physical exam findings.   This is most consistent with an acute life/limb threatening illness complicated by underlying chronic conditions. Considered meningitis, however patient's symptoms, vital signs, physical exam findings including lack of meningismus seem grossly less consistent at this time. Initial Plan:  Viral screening including COVID/flu testing to evaluate for common viral etiologies that  need to be tracked CXR to evaluate for structural/infectious intrathoracic pathology.  Empiric treatment with antipyretics including acetaminophen  in ambulatory setting Will augment with dose of ketorolac  in ED  Additionally will treat sore throat with dexamethasone  dose Objective evaluation as below reviewed   Initial Study Results:   Laboratory  All laboratory results reviewed without evidence of clinically relevant pathology.    Radiology:  All images reviewed independently. Agree with radiology report at this time.   DG Chest 2 View  Final Result         Final Assessment and Plan:   On reassessment, patient is ambulatory tolerating p.o. intake in no acute distress.   Flu + outside of tamiflu window. Patient is currently stable for outpatient care and management with no indication for hospitalization or transfer at this time.  Discussed all findings with patient expressed understanding.  Disposition:  Based on the above findings, I believe patient is stable for discharge.    Patient/family educated about specific return precautions for given chief complaint and symptoms.  Patient/family educated about follow-up with PCP.     Patient/family expressed understanding of return precautions and need for follow-up. Patient spoken to regarding all imaging and laboratory results and appropriate follow up for these results. All education provided in verbal form with additional information in written form. Time was allowed for answering of patient questions. Patient discharged.    Emergency Department Medication Summary:   Medications  ketorolac  (TORADOL ) 15 MG/ML injection 30 mg (30 mg Intramuscular Given 01/07/24 0128)  acetaminophen  (TYLENOL ) tablet 1,000 mg (1,000 mg Oral Given 01/07/24 0128)  dexamethasone  (DECADRON ) tablet 10 mg (10 mg Oral Given 01/07/24 0128)    Clinical Impression:  1. Upper respiratory tract infection, unspecified type      Discharge   Final Clinical  Impression(s) / ED Diagnoses Final diagnoses:  Upper respiratory tract infection, unspecified type    Rx / DC Orders ED Discharge Orders          Ordered    celecoxib  (CELEBREX ) 200 MG capsule  2 times daily        01/07/24 0120              Shawn Meth, MD 01/07/24 [phone]  "
